# Patient Record
Sex: Female | Born: 1975 | Hispanic: Yes | Marital: Single
Health system: Southern US, Community
[De-identification: ages and names within clinical notes are randomized; demographics above are authoritative.]

---

## 2007-05-13 ENCOUNTER — Observation Stay: Payer: Self-pay | Admitting: Obstetrics and Gynecology

## 2007-05-16 ENCOUNTER — Inpatient Hospital Stay: Payer: Self-pay | Admitting: Obstetrics and Gynecology

## 2007-05-16 ENCOUNTER — Observation Stay: Payer: Self-pay | Admitting: Obstetrics and Gynecology

## 2010-01-21 ENCOUNTER — Emergency Department: Payer: Self-pay | Admitting: Emergency Medicine

## 2010-03-02 ENCOUNTER — Emergency Department: Payer: Self-pay | Admitting: Emergency Medicine

## 2010-08-22 ENCOUNTER — Inpatient Hospital Stay: Payer: Self-pay | Admitting: Obstetrics and Gynecology

## 2021-06-20 NOTE — Progress Notes (Signed)
Patient pre-screened for BCCCP eligibility due to COVID 19 precautions. Two patient identifiers used for verification that I was speaking to correct patient.  Patient to Present directly to [facility] 06/21/21 for B[facility]CP screening mammogram. Additional view orders in.

## 2021-06-21 ENCOUNTER — Ambulatory Visit
Admission: RE | Admit: 2021-06-21 | Discharge: 2021-06-21 | Disposition: A | Discharge status: Home / Self Care | Payer: Self-pay | Source: Ambulatory Visit | Attending: Oncology | Admitting: Oncology

## 2021-06-21 ENCOUNTER — Ambulatory Visit: Payer: Self-pay | Attending: Oncology

## 2021-06-21 ENCOUNTER — Other Ambulatory Visit: Payer: Self-pay

## 2021-06-21 DIAGNOSIS — Z0000 Encounter for general adult medical examination without abnormal findings: Secondary | ICD-10-CM | POA: Insufficient documentation

## 2021-06-22 ENCOUNTER — Other Ambulatory Visit: Payer: Self-pay

## 2021-06-22 DIAGNOSIS — N6320 Unspecified lump in the left breast, unspecified quadrant: Secondary | ICD-10-CM

## 2021-07-25 ENCOUNTER — Ambulatory Visit: Admission: RE | Admit: 2021-07-25 | Payer: Self-pay | Source: Ambulatory Visit

## 2021-07-25 ENCOUNTER — Other Ambulatory Visit: Payer: Self-pay

## 2021-08-23 ENCOUNTER — Ambulatory Visit: Admission: RE | Admit: 2021-08-23 | Payer: Self-pay | Source: Ambulatory Visit

## 2021-08-23 ENCOUNTER — Other Ambulatory Visit: Payer: Self-pay

## 2021-09-07 NOTE — Progress Notes (Signed)
Patient cancelled [facility] mammogram additional view mammogram, for second time.  Message to Joellyn Quails to offer to schedule.

## 2021-09-26 ENCOUNTER — Ambulatory Visit
Admission: RE | Admit: 2021-09-26 | Discharge: 2021-09-26 | Disposition: A | Discharge status: Home / Self Care | Payer: Self-pay | Source: Ambulatory Visit | Attending: Oncology | Admitting: Oncology

## 2021-09-26 ENCOUNTER — Other Ambulatory Visit: Payer: Self-pay

## 2021-09-26 DIAGNOSIS — N6320 Unspecified lump in the left breast, unspecified quadrant: Secondary | ICD-10-CM

## 2021-10-04 NOTE — Progress Notes (Signed)
Final breast imaging Birads 2.  Copy to [facility].  Patient to return in November 2023 for annual screening mammogram.

## 2022-06-10 IMAGING — MG MM DIGITAL DIAGNOSTIC UNILAT*L* W/ TOMO W/ CAD
4 series · 4 of 12 positions shown · non-contrast
Comparison: Previous exam(s).

CLINICAL DATA: Patient was recalled from screening mammogram for a
left breast mass.

EXAM:
DIGITAL DIAGNOSTIC UNILATERAL LEFT MAMMOGRAM WITH TOMOSYNTHESIS AND
CAD; ULTRASOUND LEFT BREAST LIMITED
TECHNIQUE: Left digital diagnostic mammography and breast tomosynthesis was
performed. The images were evaluated with computer-aided detection.;
Targeted ultrasound examination of the left breast was performed.

[L CC synth-2D]
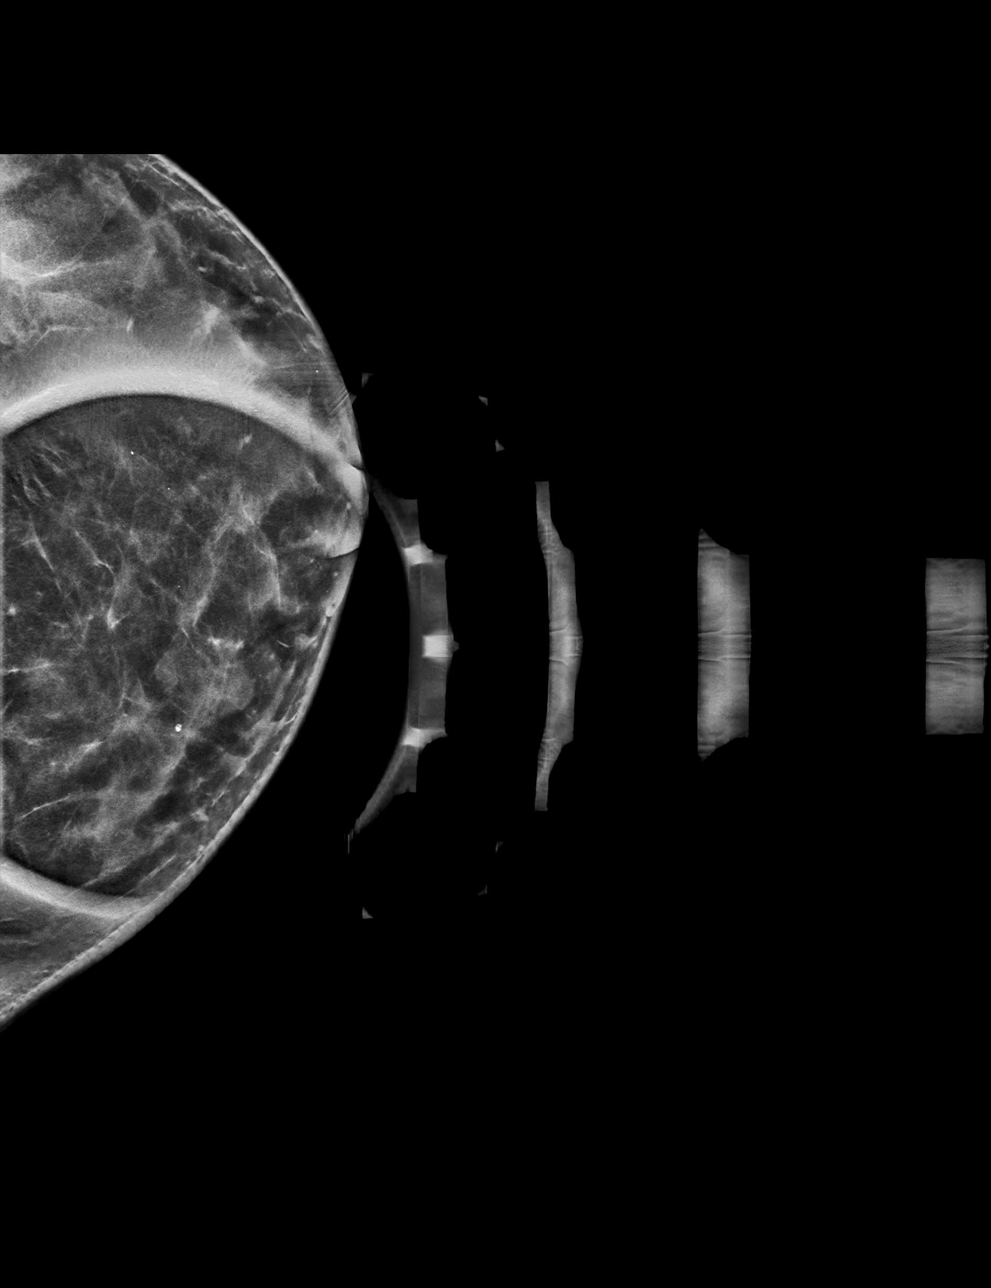

[L ML synth-2D]
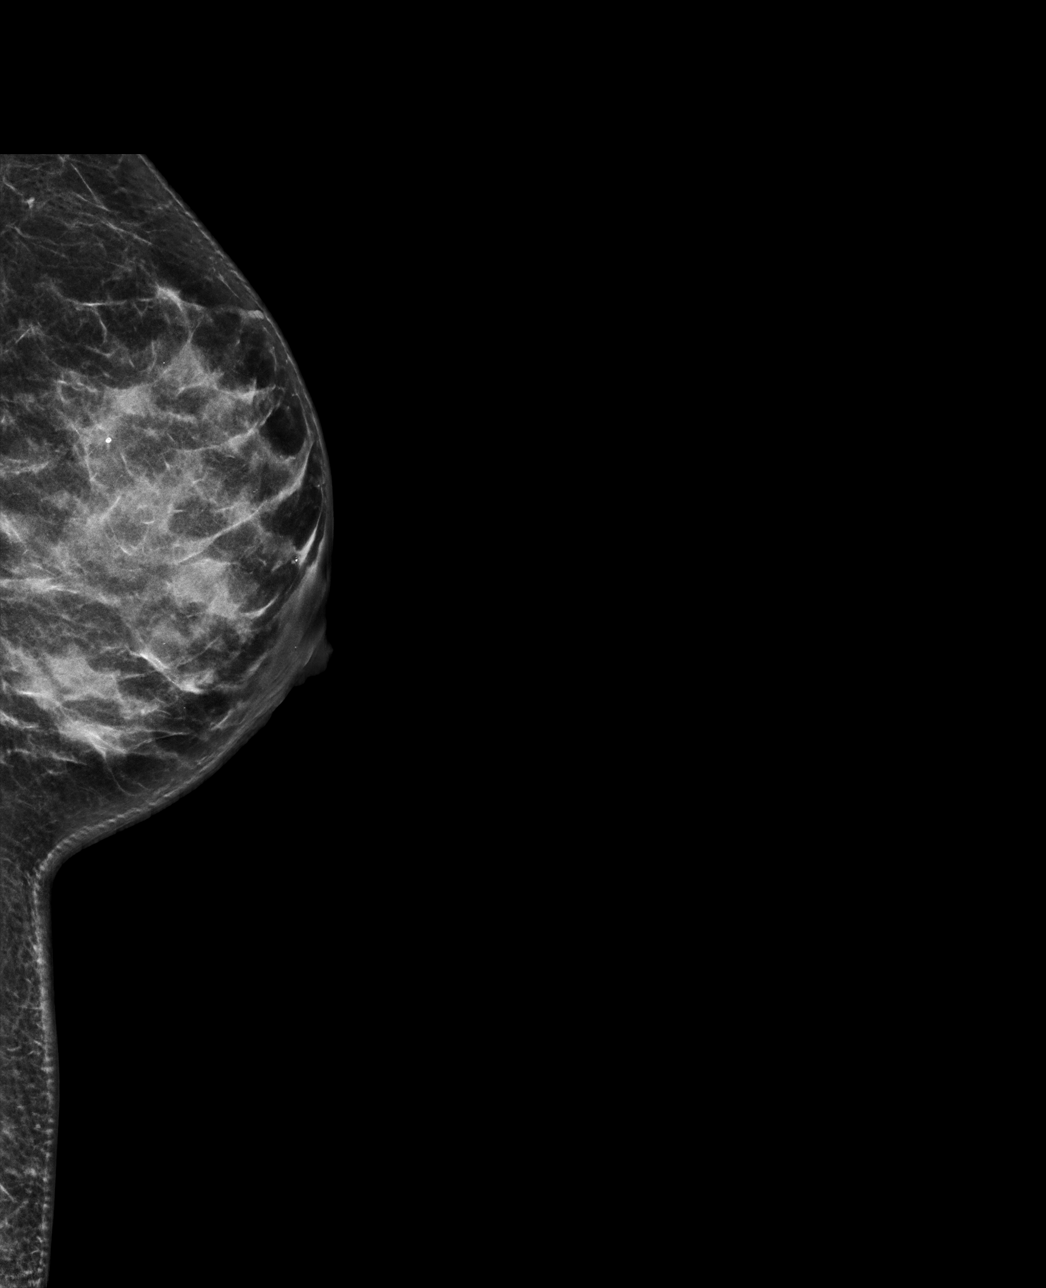

[L CC tomo · tomo slice 29/57.0]
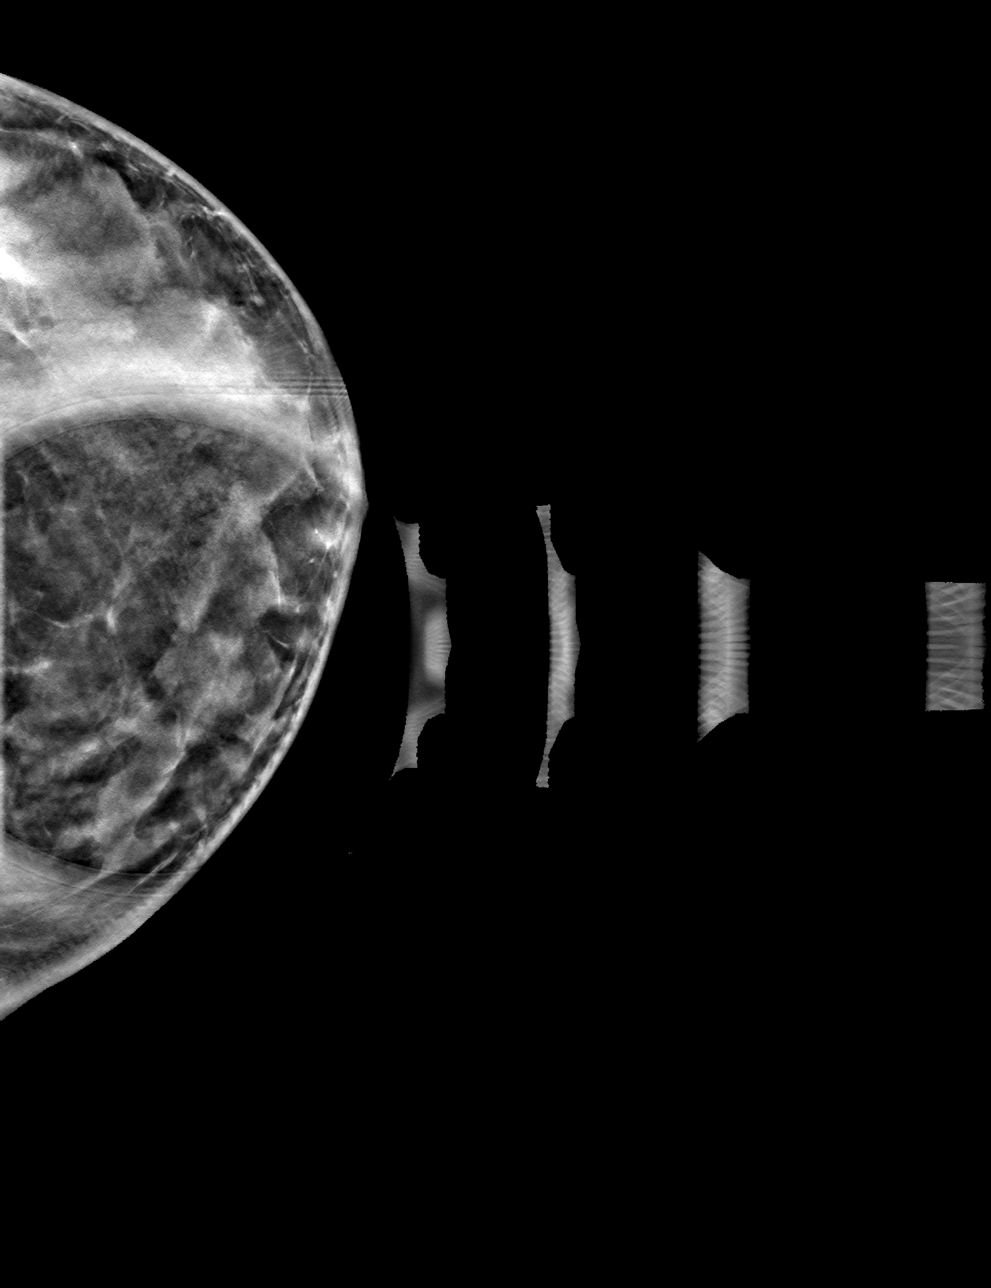

[L ML tomo · tomo slice 29/57.0]
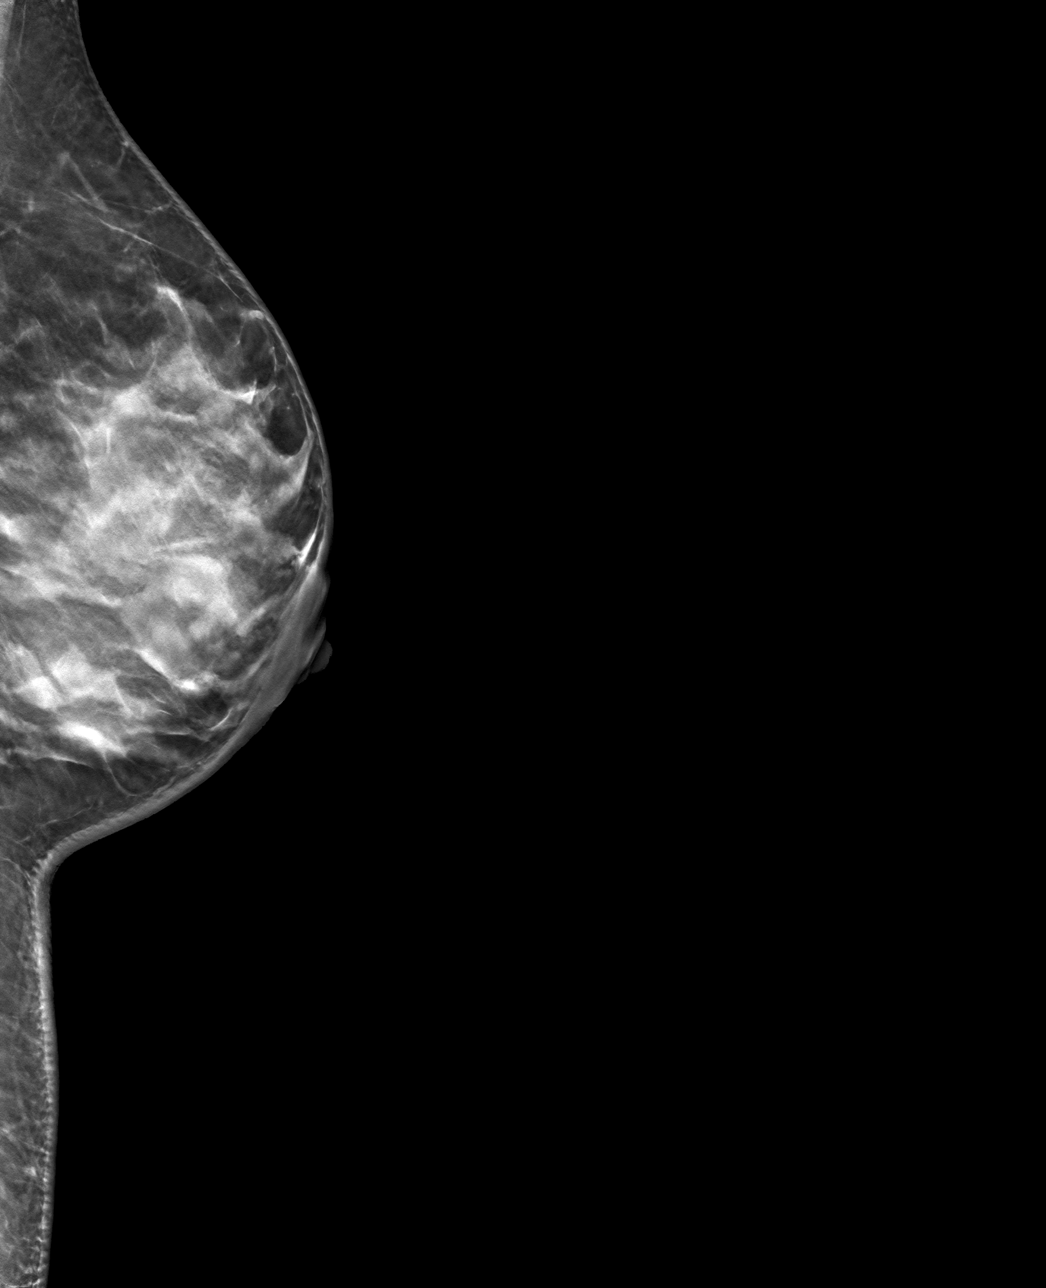

[4 of 12 positions shown; findings below may reference images not displayed]

ACR Breast Density Category c: The breast tissue is heterogeneously
dense, which may obscure small masses.
FINDINGS: Additional imaging of the left breast was performed. There is
persistence of a 1.8 cm mass in the medial aspect of the breast.

On physical exam, do not palpate a mass in the medial aspect of the
left breast.

Targeted ultrasound is performed, showing well-circumscribed
anechoic cyst in the left breast at 9 o'clock 2 cm from the nipple
measuring 1.9 x 1.1 x 1.7 cm.
IMPRESSION: Left breast cyst.  No evidence of malignancy in the left breast.

RECOMMENDATION:
Bilateral screening mammogram in Tuesday June, 2022 is recommended.

I have discussed the findings and recommendations with the patient.
If applicable, a reminder letter will be sent to the patient
regarding the next appointment.

BI-RADS CATEGORY  2: Benign.

## 2023-02-15 ENCOUNTER — Encounter: Payer: Self-pay | Admitting: Physician Assistant

## 2023-05-01 ENCOUNTER — Other Ambulatory Visit: Payer: Self-pay

## 2023-05-01 DIAGNOSIS — Z1231 Encounter for screening mammogram for malignant neoplasm of breast: Secondary | ICD-10-CM

## 2023-05-14 ENCOUNTER — Ambulatory Visit: Payer: Self-pay | Attending: Hematology and Oncology | Admitting: Hematology and Oncology

## 2023-05-14 ENCOUNTER — Ambulatory Visit
Admission: RE | Admit: 2023-05-14 | Discharge: 2023-05-14 | Disposition: A | Discharge status: Home / Self Care | Payer: Self-pay | Source: Ambulatory Visit | Attending: Obstetrics and Gynecology | Admitting: Obstetrics and Gynecology

## 2023-05-14 VITALS — BP 113/64 | Wt 137.2 lb

## 2023-05-14 DIAGNOSIS — Z1231 Encounter for screening mammogram for malignant neoplasm of breast: Secondary | ICD-10-CM | POA: Insufficient documentation

## 2023-05-14 NOTE — Patient Instructions (Signed)
Taught Miranda Ellis about self breast awareness and gave educational materials to take home. Patient did not need a Pap smear today due to last Pap smear was in 02/13/2023 per patient.  Let her know BCCCP will cover Pap smears every 5 years unless has a history of abnormal Pap smears. Referred patient to the [facility] for screening mammogram. Appointment scheduled for 05/14/2023. Patient aware of appointment and will be there. Let patient know will follow up with her within the next couple weeks with results. Miranda Ellis verbalized understanding.  Pascal Lux, NP 10:28 AM

## 2023-05-14 NOTE — Progress Notes (Addendum)
Ms. Miranda Ellis is a 47 y.o. female who presents to [facility] clinic today with no complaints.    Pap Smear: Pap not smear completed today. Last Pap smear was 02/13/2023 at [facility] clinic and was normal. - ASCUS/ HPV-. Per patient has no history of an abnormal Pap smear. Last Pap smear result is available in Epic.   Physical exam: Breasts Breasts symmetrical. No skin abnormalities bilateral breasts. No nipple retraction bilateral breasts. No nipple discharge bilateral breasts. No lymphadenopathy. No lumps palpated bilateral breasts.  MS DIGITAL DIAG TOMO UNI LEFT  Result Date: 09/26/2021 CLINICAL DATA:  Patient was recalled from screening mammogram for a left breast mass. EXAM: DIGITAL DIAGNOSTIC UNILATERAL LEFT MAMMOGRAM WITH TOMOSYNTHESIS AND CAD; ULTRASOUND LEFT BREAST LIMITED TECHNIQUE: Left digital diagnostic mammography and breast tomosynthesis was performed. The images were evaluated with computer-aided detection.; Targeted ultrasound examination of the left breast was performed. COMPARISON:  Previous exam(s). ACR Breast Density Category c: The breast tissue is heterogeneously dense, which may obscure small masses. FINDINGS: Additional imaging of the left breast was performed. There is persistence of a 1.8 cm mass in the medial aspect of the breast. On physical exam, do not palpate a mass in the medial aspect of the left breast. Targeted ultrasound is performed, showing well-circumscribed anechoic cyst in the left breast at 9 o'clock 2 cm from the nipple measuring 1.9 x 1.1 x 1.7 cm. IMPRESSION: Left breast cyst.  No evidence of malignancy in the left breast. RECOMMENDATION: Bilateral screening mammogram in November of 2023 is recommended. I have discussed the findings and recommendations with the patient. If applicable, a reminder letter will be sent to the patient regarding the next appointment. BI-RADS CATEGORY  2: Benign. Electronically Signed   By: Baird Lyons M.D.   On:  09/26/2021 14:29  MS DIGITAL SCREENING TOMO BILATERAL  Result Date: 06/21/2021 CLINICAL DATA:  Screening. EXAM: DIGITAL SCREENING BILATERAL MAMMOGRAM WITH TOMOSYNTHESIS AND CAD TECHNIQUE: Bilateral screening digital craniocaudal and mediolateral oblique mammograms were obtained. Bilateral screening digital breast tomosynthesis was performed. The images were evaluated with computer-aided detection. COMPARISON:  None. ACR Breast Density Category d: The breast tissue is extremely dense, which lowers the sensitivity of mammography. FINDINGS: In the left breast, a possible mass warrants further evaluation. In the right breast, no findings suspicious for malignancy. IMPRESSION: Further evaluation is suggested for a possible mass in the left breast. RECOMMENDATION: Diagnostic mammogram and possibly ultrasound of the left breast. (Code:[id number]) The patient will be contacted regarding the findings, and additional imaging will be scheduled. BI-RADS CATEGORY  0: Incomplete. Need additional imaging evaluation and/or prior mammograms for comparison. Electronically Signed   By: Annia Belt M.D.   On: 06/21/2021 15:52         Pelvic/Bimanual Pap is not indicated today    Smoking History: Patient has never smoked and was not referred to quit line.    Patient Navigation: Patient education provided. Access to services provided for patient through BCCCP program. Delos Haring interpreter provided. No transportation provided   Colorectal Cancer Screening: Per patient has never had colonoscopy completed No complaints today. FIT test negative 03/26/2023 per Miranda Ellis   Breast and Cervical Cancer Risk Assessment: Patient does not have family history of breast cancer, known genetic mutations, or radiation treatment to the chest before age 25. Patient does not have history of cervical dysplasia, immunocompromised, or DES exposure in-utero.  Risk Scores as of Encounter on 05/14/2023     [name]  5-year 0.98%    Lifetime 10.33%   This patient is [place]/Lat[place] but has no documented birth country, so the [place] model used data from [place] patients to calculate their risk score. Document a birth country in the Demographics activity for a more accurate score.         Last calculated by Narda Rutherford, LPN on 40/04/8118 at 10:12 AM          A: BCCCP exam without pap smear No complaints with benign exam.   P: Referred patient to the [facility] for a screening mammogram. Appointment scheduled 05/14/2023.  Pascal Lux, NP 05/14/2023 9:54 AM

## 2024-04-08 ENCOUNTER — Telehealth: Payer: Self-pay | Admitting: *Deleted

## 2024-04-29 ENCOUNTER — Other Ambulatory Visit: Payer: Self-pay | Admitting: Family Medicine

## 2024-04-29 DIAGNOSIS — Z1231 Encounter for screening mammogram for malignant neoplasm of breast: Secondary | ICD-10-CM

## 2024-05-23 ENCOUNTER — Ambulatory Visit
Admission: RE | Admit: 2024-05-23 | Discharge: 2024-05-23 | Disposition: A | Payer: Self-pay | Source: Ambulatory Visit | Attending: Family Medicine | Admitting: Family Medicine

## 2024-05-23 DIAGNOSIS — Z1231 Encounter for screening mammogram for malignant neoplasm of breast: Secondary | ICD-10-CM | POA: Insufficient documentation
